# Patient Record
Sex: Male | Born: 1945 | Race: White | Hispanic: No
Health system: Southern US, Community
[De-identification: ages and names within clinical notes are randomized; demographics above are authoritative.]

---

## 2018-04-25 ENCOUNTER — Other Ambulatory Visit: Payer: Self-pay

## 2018-04-25 ENCOUNTER — Emergency Department (HOSPITAL_COMMUNITY)
Admission: EM | Admit: 2018-04-25 | Discharge: 2018-04-26 | Disposition: A | Discharge status: Home / Self Care | Payer: Medicare HMO | Attending: Emergency Medicine | Admitting: Emergency Medicine

## 2018-04-25 DIAGNOSIS — G309 Alzheimer's disease, unspecified: Secondary | ICD-10-CM | POA: Diagnosis not present

## 2018-04-25 DIAGNOSIS — R4182 Altered mental status, unspecified: Secondary | ICD-10-CM | POA: Diagnosis present

## 2018-04-25 DIAGNOSIS — F0280 Dementia in other diseases classified elsewhere without behavioral disturbance: Secondary | ICD-10-CM
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
## 2018-04-25 NOTE — ED Notes (Signed)
Per family are wanting a drug screening due to suspicious drug use or something odd in the home.

## 2018-04-25 NOTE — ED Notes (Signed)
Patient's girlfriend no. In [place]: Elsie - [phone] .

## 2018-04-26 ENCOUNTER — Emergency Department (HOSPITAL_COMMUNITY): Payer: Medicare HMO

## 2018-04-26 LAB — COMPREHENSIVE METABOLIC PANEL
ALT: 20 U/L (ref 0–44)
AST: 25 U/L (ref 15–41)
Albumin: 4.1 g/dL (ref 3.5–5.0)
Alkaline Phosphatase: 68 U/L (ref 38–126)
Anion gap: 10 (ref 5–15)
BILIRUBIN TOTAL: 1.2 mg/dL (ref 0.3–1.2)
BUN: 15 mg/dL (ref 8–23)
CHLORIDE: 107 mmol/L (ref 98–111)
CO2: 23 mmol/L (ref 22–32)
CREATININE: 0.82 mg/dL (ref 0.61–1.24)
Calcium: 9.5 mg/dL (ref 8.9–10.3)
GFR calc Af Amer: >60 mL/min (ref >60)
Glucose, Bld: 128 mg/dL — ABNORMAL HIGH (ref 70–99)
Potassium: 3.8 mmol/L (ref 3.5–5.1)
Sodium: 140 mmol/L (ref 135–145)
TOTAL PROTEIN: 7.2 g/dL (ref 6.5–8.1)

## 2018-04-26 LAB — CBC
HEMATOCRIT: 45.9 % (ref 39.0–52.0)
HEMOGLOBIN: 15.2 g/dL (ref 13.0–17.0)
MCH: 28.8 pg (ref 26.0–34.0)
MCHC: 33.1 g/dL (ref 30.0–36.0)
MCV: 87.1 fL (ref 78.0–100.0)
Platelets: 281 10*3/uL (ref 150–400)
RBC: 5.27 MIL/uL (ref 4.22–5.81)
RDW: 13.5 % (ref 11.5–15.5)
WBC: 10.7 10*3/uL — ABNORMAL HIGH (ref 4.0–10.5)

## 2018-04-26 LAB — ETHANOL

## 2018-04-26 LAB — RAPID URINE DRUG SCREEN, HOSP PERFORMED
AMPHETAMINES: NOT DETECTED
Barbiturates: NOT DETECTED
Benzodiazepines: NOT DETECTED
Cocaine: NOT DETECTED
Opiates: NOT DETECTED
Tetrahydrocannabinol: NOT DETECTED

## 2018-04-26 NOTE — ED Notes (Signed)
Patient transported to CT 

## 2018-04-26 NOTE — Discharge Instructions (Addendum)
Follow up with your doctor

## 2018-04-26 NOTE — ED Notes (Signed)
Patient/caregiver verbalizes understanding of medications and discharge instructions. No further questions at this time. VSS and patient ambulatory at discharge.
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
## 2019-03-06 IMAGING — CT CT HEAD W/O CM
4 series · 16 of 47 positions shown, 18 images · non-contrast
Comparison: None.

CLINICAL DATA: Patient with memory loss.

EXAM:
CT HEAD WITHOUT CONTRAST
TECHNIQUE: Contiguous axial images were obtained from the base of the skull
through the vertex without intravenous contrast.

[Series 3: head wo · axial · 0.44mm/px · z∈[-81,+39]mm · 7 of 34 slices shown, 9 images]
[im 5/34  brain]
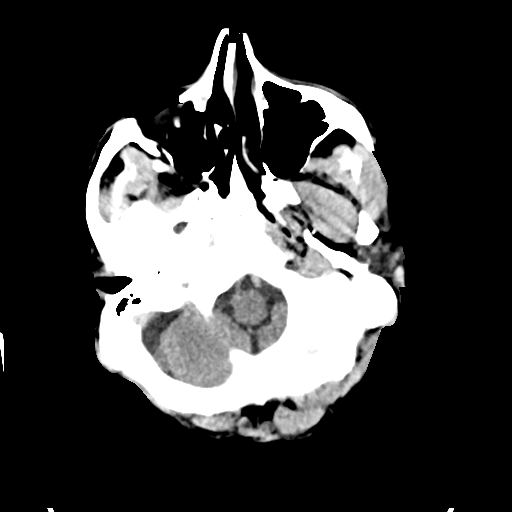
[im 5/34  bone]
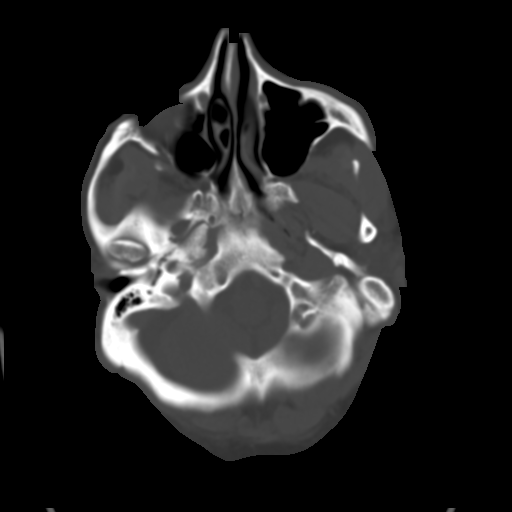
[im 9/34  brain]
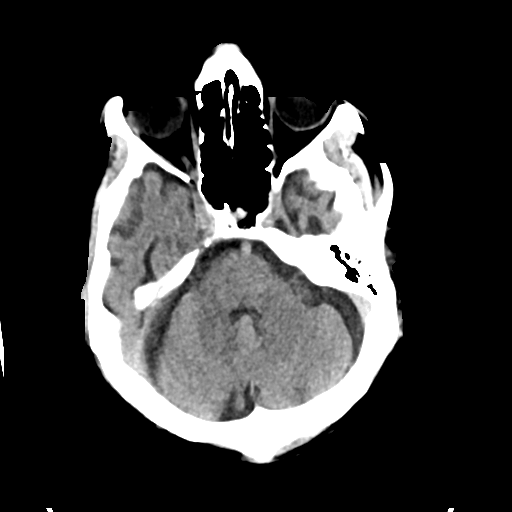
[im 13/34  brain]
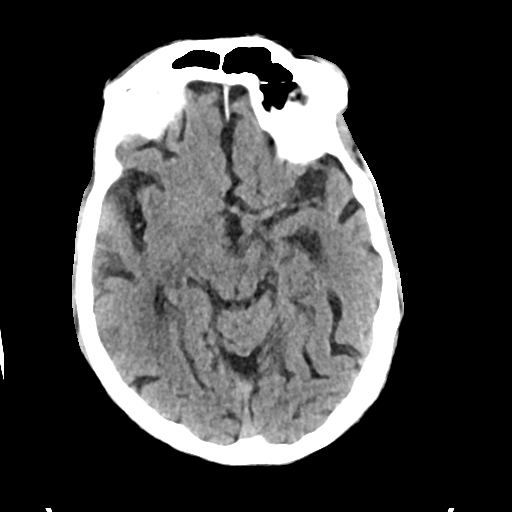
[im 17/34  brain]
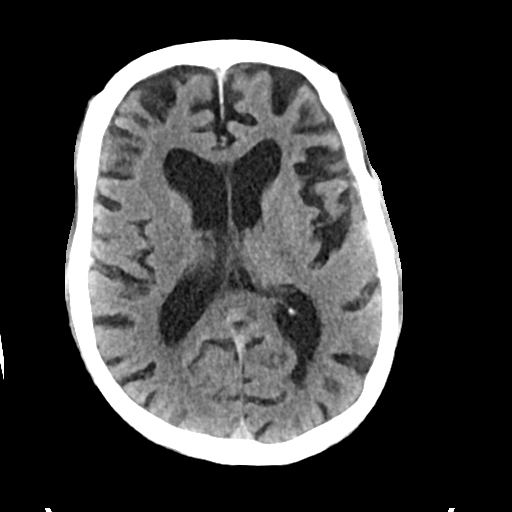
[im 21/34  brain]
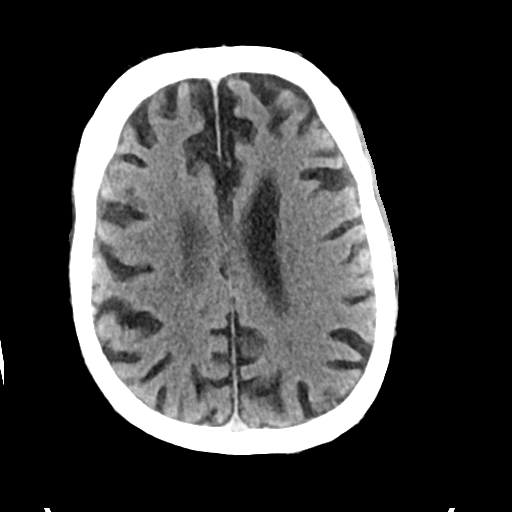
[im 21/34  bone]
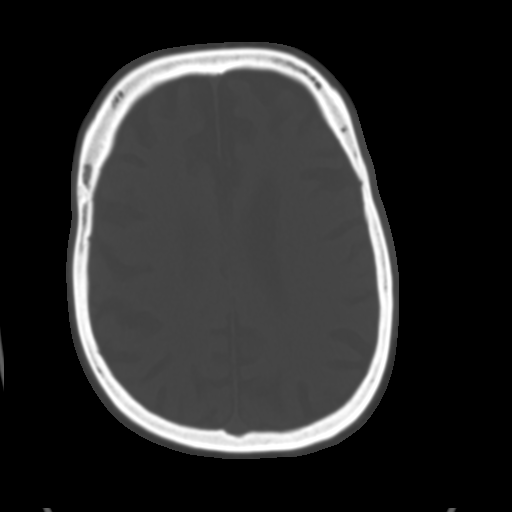
[im 25/34  brain]
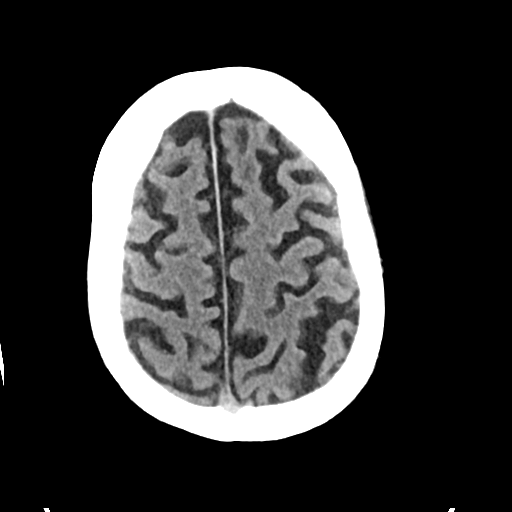
[im 29/34  brain]
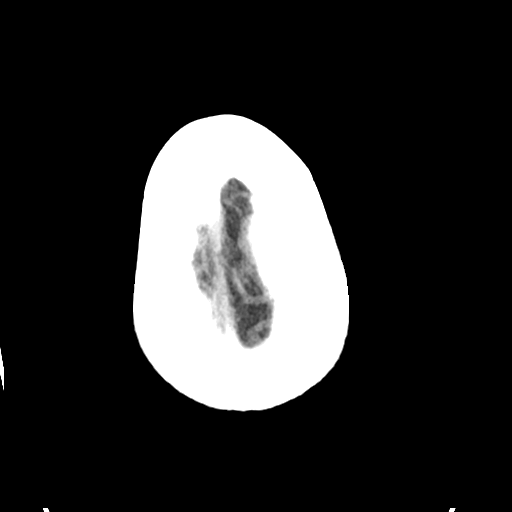

[Series 4: head bone · axial · 0.44mm/px · z∈[-85,-53]mm · 3 of 82 slices shown]
[im 9/82  bone]
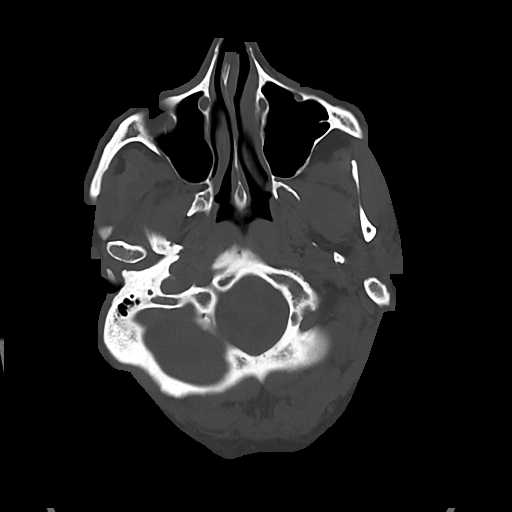
[im 17/82  bone]
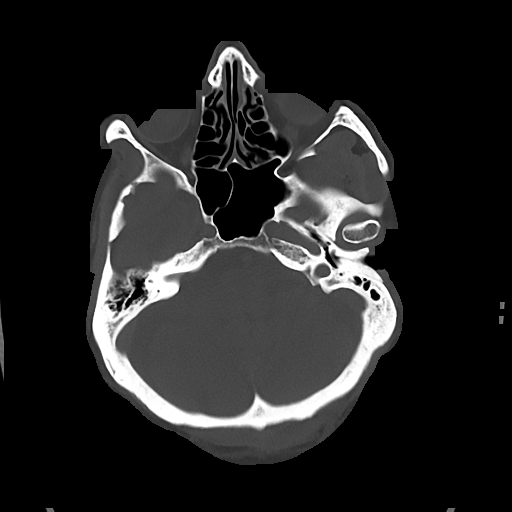
[im 25/82  bone]
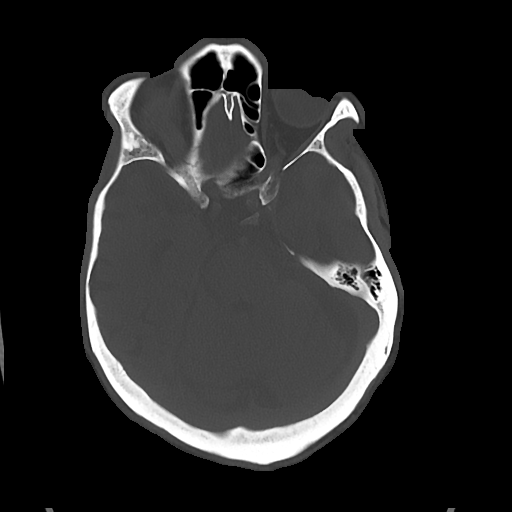

[Series 5: cor soft · coronal · 0.32mm/px · 3 of 67 slices shown]
[im 23/67  brain]
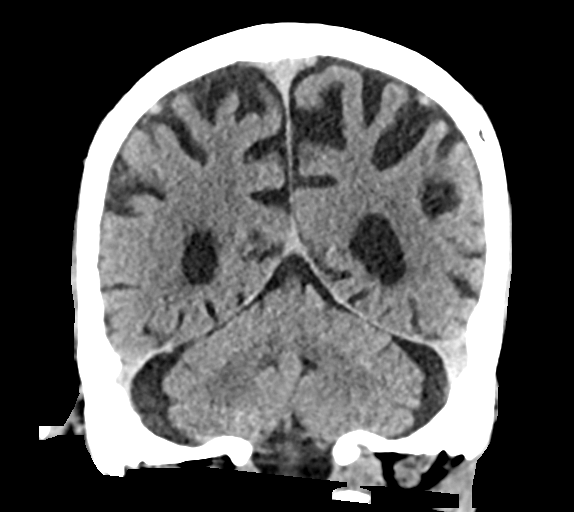
[im 30/67  brain]
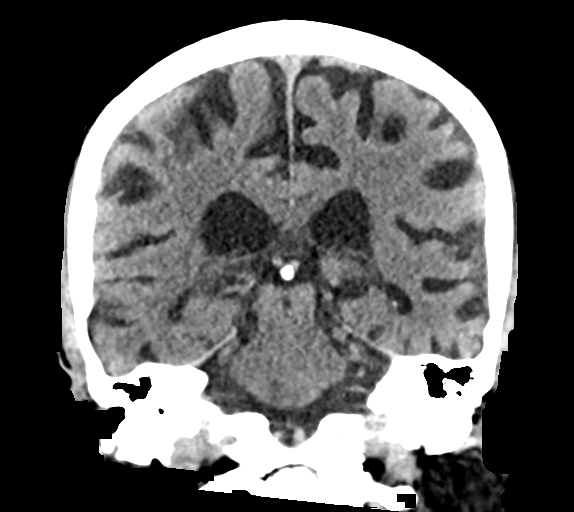
[im 37/67  brain]
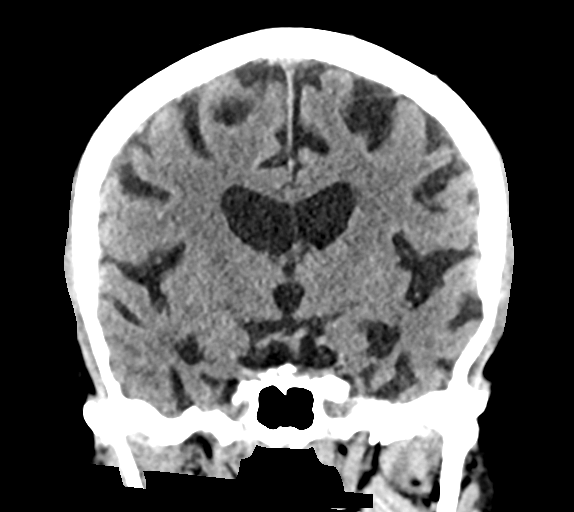

[Series 6: sag soft · sagittal · 0.32mm/px · 3 of 60 slices shown]
[im 20/60  brain]
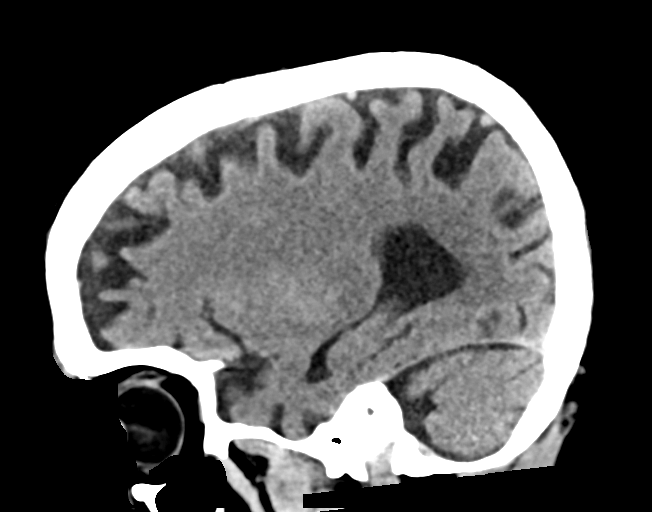
[im 30/60  brain]
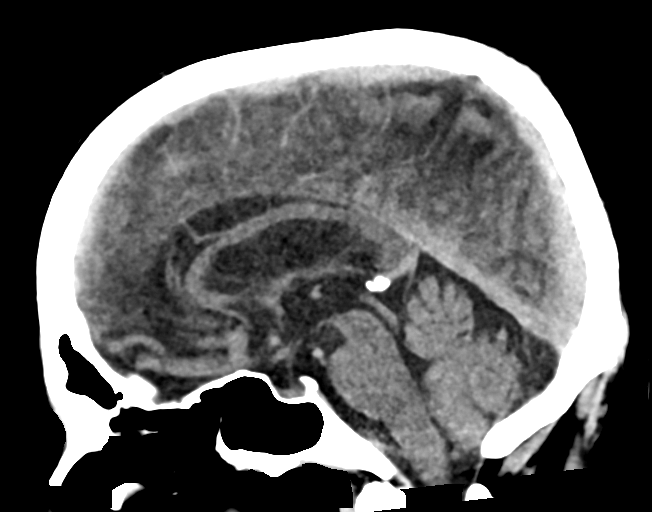
[im 40/60  brain]
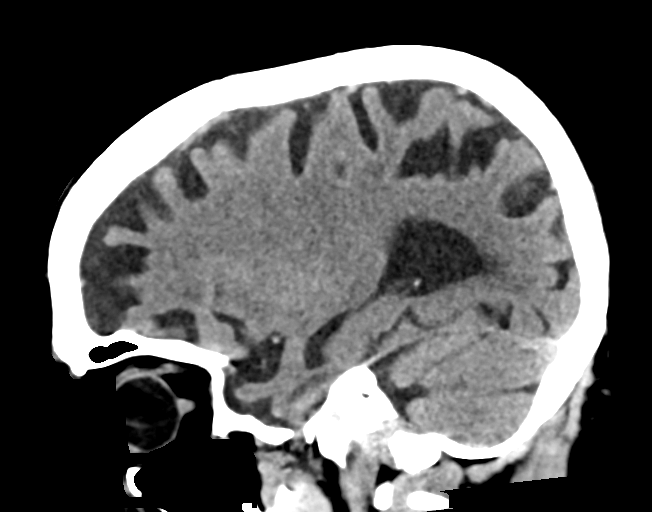

[16 of 47 positions shown; findings below may reference images not displayed]

FINDINGS: Brain: Ventricles and sulci are prominent compatible with atrophy.
No evidence for acute cortically based infarct, intracranial
hemorrhage, mass lesion or mass-effect.

Vascular: Internal carotid arterial vascular calcifications.

Skull: Intact.

Sinuses/Orbits: Paranasal sinuses are well aerated. Mastoid air
cells unremarkable. Orbits are unremarkable.

Other: None.
IMPRESSION: Cortical atrophy.

No acute intracranial process.
